# Patient Record
Sex: Male | Born: 1985
Health system: Southern US, Community
[De-identification: ages and names within clinical notes are randomized; demographics above are authoritative.]

## PROBLEM LIST (undated history)

## (undated) DIAGNOSIS — Z903 Acquired absence of stomach [part of]: Secondary | ICD-10-CM

## (undated) DIAGNOSIS — F419 Anxiety disorder, unspecified: Secondary | ICD-10-CM

## (undated) DIAGNOSIS — F32A Depression, unspecified: Secondary | ICD-10-CM

## (undated) DIAGNOSIS — Z9049 Acquired absence of other specified parts of digestive tract: Secondary | ICD-10-CM

## (undated) DIAGNOSIS — K649 Unspecified hemorrhoids: Secondary | ICD-10-CM

## (undated) DIAGNOSIS — Z8719 Personal history of other diseases of the digestive system: Secondary | ICD-10-CM

## (undated) DIAGNOSIS — J45909 Unspecified asthma, uncomplicated: Secondary | ICD-10-CM

## (undated) DIAGNOSIS — Z9889 Other specified postprocedural states: Secondary | ICD-10-CM

## (undated) HISTORY — PX: LAPAROSCOPIC GASTRIC SLEEVE RESECTION: SHX5895

## (undated) HISTORY — PX: OTHER SURGICAL HISTORY: SHX169

## (undated) HISTORY — PX: APPENDECTOMY: SHX54

## (undated) HISTORY — PX: HERNIA REPAIR: SHX51

## (undated) HISTORY — PX: HEMORRHOID SURGERY: SHX153

## (undated) HISTORY — PX: HIP SURGERY: SHX245

## (undated) HISTORY — DX: Personal history of other diseases of the digestive system: Z87.19

## (undated) HISTORY — DX: Anxiety disorder, unspecified: F41.9

## (undated) HISTORY — DX: Acquired absence of other specified parts of digestive tract: Z90.49

## (undated) HISTORY — DX: Acquired absence of stomach (part of): Z90.3

## (undated) HISTORY — DX: Unspecified hemorrhoids: K64.9

## (undated) HISTORY — DX: Other specified postprocedural states: Z98.890

## (undated) HISTORY — DX: Depression, unspecified: F32.A

## (undated) HISTORY — DX: Unspecified asthma, uncomplicated: J45.909

---

## 2019-11-09 ENCOUNTER — Encounter: Payer: Self-pay | Admitting: Emergency Medicine

## 2019-11-09 ENCOUNTER — Other Ambulatory Visit: Payer: Self-pay

## 2019-11-09 ENCOUNTER — Emergency Department
Admission: EM | Admit: 2019-11-09 | Discharge: 2019-11-09 | Disposition: A | Discharge status: Home / Self Care | Payer: Commercial Managed Care - PPO | Attending: Emergency Medicine | Admitting: Emergency Medicine

## 2019-11-09 ENCOUNTER — Emergency Department: Payer: Commercial Managed Care - PPO

## 2019-11-09 DIAGNOSIS — S93401A Sprain of unspecified ligament of right ankle, initial encounter: Secondary | ICD-10-CM | POA: Diagnosis not present

## 2019-11-09 DIAGNOSIS — Y9301 Activity, walking, marching and hiking: Secondary | ICD-10-CM | POA: Diagnosis not present

## 2019-11-09 DIAGNOSIS — Y990 Civilian activity done for income or pay: Secondary | ICD-10-CM | POA: Insufficient documentation

## 2019-11-09 DIAGNOSIS — Y9269 Other specified industrial and construction area as the place of occurrence of the external cause: Secondary | ICD-10-CM | POA: Diagnosis not present

## 2019-11-09 DIAGNOSIS — W1842XA Slipping, tripping and stumbling without falling due to stepping into hole or opening, initial encounter: Secondary | ICD-10-CM | POA: Diagnosis not present

## 2019-11-09 DIAGNOSIS — S299XXA Unspecified injury of thorax, initial encounter: Secondary | ICD-10-CM

## 2019-11-09 DIAGNOSIS — W19XXXA Unspecified fall, initial encounter: Secondary | ICD-10-CM

## 2019-11-09 DIAGNOSIS — S99911A Unspecified injury of right ankle, initial encounter: Secondary | ICD-10-CM | POA: Diagnosis present

## 2019-11-09 MED ORDER — LIDOCAINE 5 % EX PTCH: 1.0000 | MEDICATED_PATCH | Freq: Two times a day (BID) | CUTANEOUS | 0 refills | Status: AC

## 2019-11-09 MED ORDER — OXYCODONE-ACETAMINOPHEN 5-325 MG PO TABS: 1.0000 | ORAL_TABLET | Freq: Four times a day (QID) | ORAL | 0 refills | Status: AC | PRN

## 2019-11-09 MED ORDER — IBUPROFEN 600 MG PO TABS: 600.0000 mg | ORAL_TABLET | Freq: Four times a day (QID) | ORAL | 0 refills | Status: DC | PRN

## 2019-11-09 NOTE — ED Notes (Signed)
Pt verbalizes understanding of d/c instructions, medications and follow up 

## 2019-11-09 NOTE — ED Triage Notes (Signed)
Pt in via EMS from work with c/o pain to left ribs  Pt was on bouncy house Saturday and felt a pop in the left side of his ribs and has had intermittent pain since then

## 2019-11-09 NOTE — ED Triage Notes (Signed)
Pt reports Saturday was at a birthday party and was jumping around in a bouncy house and hurt his left side ribs. Pt states this am he felt a pop in his ribs when he bent over.

## 2019-11-09 NOTE — ED Provider Notes (Signed)
[facility] Emergency Department Provider Note  ____________________________________________  Time seen: Approximately 10:44 AM  I have reviewed the triage vital signs and the nursing notes.   HISTORY  Chief Complaint Rib Injury    HPI Brandon Stout is a 34 y.o. male that presents to the emergency department for evaluation of left rib injury.  Patient states that over the weekend, he was on a bouncy house when he initially hurt his left side.  He had some pain over the weekend but seemed like it was getting better.  This morning he went over to bend his shoe, heard a pop and pain has been worse since.  Pain to the spot on his ribs is worse with deep breathing.  No shortness of breath, nausea, vomiting, abdominal pain.   History reviewed. No pertinent past medical history.  There are no problems to display for this patient.   History reviewed. No pertinent surgical history.  Prior to Admission medications   Not on File    Allergies Patient has no allergy information on record.  No family history on file.  Social History Social History   Tobacco Use  . Smoking status: Not on file  Substance Use Topics  . Alcohol use: Not on file  . Drug use: Not on file     Review of Systems  Respiratory: No cough. No SOB. Gastrointestinal: No abdominal pain.  No nausea, no vomiting.  Musculoskeletal: Positive for left rib pain. Skin: Negative for rash, abrasions, lacerations, ecchymosis. Neurological: Negative for headaches, numbness or tingling   ____________________________________________   PHYSICAL EXAM:  VITAL SIGNS: ED Triage Vitals  Enc Vitals Group     BP --      Pulse Rate 11/09/19 0951 85     Resp 11/09/19 0951 20     Temp 11/09/19 0951 98.3 F (36.8 C)     Temp Source 11/09/19 0951 Oral     SpO2 11/09/19 0951 98 %     Weight 11/09/19 0945 175 lb (79.4 kg)     Height 11/09/19 0945 5\' 8"  (1.727 m)     Head Circumference --       Peak Flow --      Pain Score 11/09/19 0945 10     Pain Loc --      Pain Edu? --      Excl. in GC? --      Constitutional: Alert and oriented. Well appearing and in no acute distress. Eyes: Conjunctivae are normal. PERRL. EOMI. Head: Atraumatic. ENT:      Ears:      Nose: No congestion/rhinnorhea.      Mouth/Throat: Mucous membranes are moist.  Neck: No stridor.  Cardiovascular: Normal rate, regular rhythm.  Good peripheral circulation. Respiratory: Normal respiratory effort without tachypnea or retractions. Lungs CTAB. Good air entry to the bases with no decreased or absent breath sounds. Gastrointestinal: Bowel sounds 4 quadrants. Soft and nontender to palpation. No guarding or rigidity. No palpable masses. No distention.  Musculoskeletal: Full range of motion to all extremities. No gross deformities appreciated.  Pinpoint tenderness to palpation to left anterior inferior chest wall. Neurologic:  Normal speech and language. No gross focal neurologic deficits are appreciated.  Skin:  Skin is warm, dry and intact. No rash noted. Psychiatric: Mood and affect are normal. Speech and behavior are normal. Patient exhibits appropriate insight and judgement.   ____________________________________________   LABS (all labs ordered are listed, but only abnormal results are displayed)  Labs Reviewed -  No data to display ____________________________________________  EKG   ____________________________________________  RADIOLOGY Lexine Baton, personally viewed and evaluated these images (plain radiographs) as part of my medical decision making, as well as reviewing the written report by the radiologist.  DG Ribs Unilateral W/Chest Left  Result Date: 11/09/2019 CLINICAL DATA:  Pain after exaggerated motion EXAM: LEFT RIBS AND CHEST - 3+ VIEW COMPARISON:  None. FINDINGS: Frontal chest as well as oblique and cone-down rib images were obtained. Lungs are clear. Heart size and pulmonary  vascularity are normal. No adenopathy. No pneumothorax or pleural effusion. No evident rib fracture. IMPRESSION: No evident rib fracture.  Lungs clear. Electronically Signed   By: Bretta Bang III M.D.   On: 11/09/2019 10:13    ____________________________________________    PROCEDURES  Procedure(s) performed:    Procedures    Medications - No data to display   ____________________________________________   INITIAL IMPRESSION / ASSESSMENT AND PLAN / ED COURSE  Pertinent labs & imaging results that were available during my care of the patient were reviewed by me and considered in my medical decision making (see chart for details).  Review of the [employer] CSRS was performed in accordance of the [facility] prior to dispensing any controlled drugs.   Patient presented to the emergency department for evaluation of rib injury.  Vital signs and exam are reassuring.  X-ray negative for acute bony abnormality or cardiopulmonary processes.  Patient does have pinpoint tenderness to palpation to left, inferior, anterior rib cage and may have a small fracture not identified on x-ray.  Patient will be discharged home with prescriptions for a short course of Percocet. Patient is to follow up with primary care orthopedics as directed. Patient is given ED precautions to return to the ED for any worsening or new symptoms.   [name] was evaluated in Emergency Department on 11/09/2019 for the symptoms described in the history of present illness.  Patient was seen and evaluated by myself in triage. He was evaluated in the context of the global COVID-19 pandemic, which necessitated consideration that the patient might be at risk for infection with the SARS-CoV-2 virus that causes COVID-19. Institutional protocols and algorithms that pertain to the evaluation of patients at risk for COVID-19 are in a state of rapid change based on information released by regulatory bodies including the CDC and federal and  state organizations. These policies and algorithms were followed during the patient's care in the ED.  ____________________________________________  FINAL CLINICAL IMPRESSION(S) / ED DIAGNOSES  Final diagnoses:  Rib injury  Fall, initial encounter      NEW MEDICATIONS STARTED DURING THIS VISIT:  ED Discharge Orders    None          This chart was dictated using voice recognition software/Dragon. Despite best efforts to proofread, errors can occur which can change the meaning. Any change was purely unintentional.    Enid Derry, PA-C 11/09/19 1145    Shaune Pollack, MD 11/10/19 1218

## 2019-11-09 NOTE — Discharge Instructions (Signed)
Your rib x-ray was normal and did not show a fracture.  A small fracture may not show up right away on x-ray.  You may take ibuprofen for pain and swelling and Percocet for severe pain.

## 2019-11-22 ENCOUNTER — Other Ambulatory Visit: Payer: Self-pay | Admitting: Physician Assistant

## 2019-11-22 DIAGNOSIS — S299XXA Unspecified injury of thorax, initial encounter: Secondary | ICD-10-CM

## 2019-11-23 ENCOUNTER — Other Ambulatory Visit: Payer: Self-pay | Admitting: Physician Assistant

## 2019-11-23 DIAGNOSIS — S299XXA Unspecified injury of thorax, initial encounter: Secondary | ICD-10-CM

## 2019-11-23 DIAGNOSIS — R1012 Left upper quadrant pain: Secondary | ICD-10-CM

## 2019-11-24 ENCOUNTER — Ambulatory Visit
Admission: RE | Admit: 2019-11-24 | Discharge: 2019-11-24 | Disposition: A | Discharge status: Home / Self Care | Payer: Commercial Managed Care - PPO | Source: Ambulatory Visit | Attending: Physician Assistant | Admitting: Physician Assistant

## 2019-11-24 ENCOUNTER — Other Ambulatory Visit: Payer: Self-pay

## 2019-11-24 DIAGNOSIS — R1012 Left upper quadrant pain: Secondary | ICD-10-CM | POA: Diagnosis present

## 2019-11-24 DIAGNOSIS — S299XXA Unspecified injury of thorax, initial encounter: Secondary | ICD-10-CM

## 2019-11-30 ENCOUNTER — Other Ambulatory Visit: Payer: Self-pay

## 2019-11-30 ENCOUNTER — Ambulatory Visit (INDEPENDENT_AMBULATORY_CARE_PROVIDER_SITE_OTHER): Payer: Self-pay

## 2019-11-30 ENCOUNTER — Other Ambulatory Visit: Payer: Self-pay | Admitting: Physician Assistant

## 2019-11-30 DIAGNOSIS — Z021 Encounter for pre-employment examination: Secondary | ICD-10-CM

## 2021-03-22 IMAGING — CR DG RIBS W/ CHEST 3+V*L*
1 series · 3 of 3 positions shown · non-contrast
Comparison: None.

CLINICAL DATA: Pain after exaggerated motion

EXAM:
LEFT RIBS AND CHEST - 3+ VIEW

[Series 1: dg ribs unilateral w/chest left · 0.14mm/px · 3 of 3 slices shown]
[im 1/3]
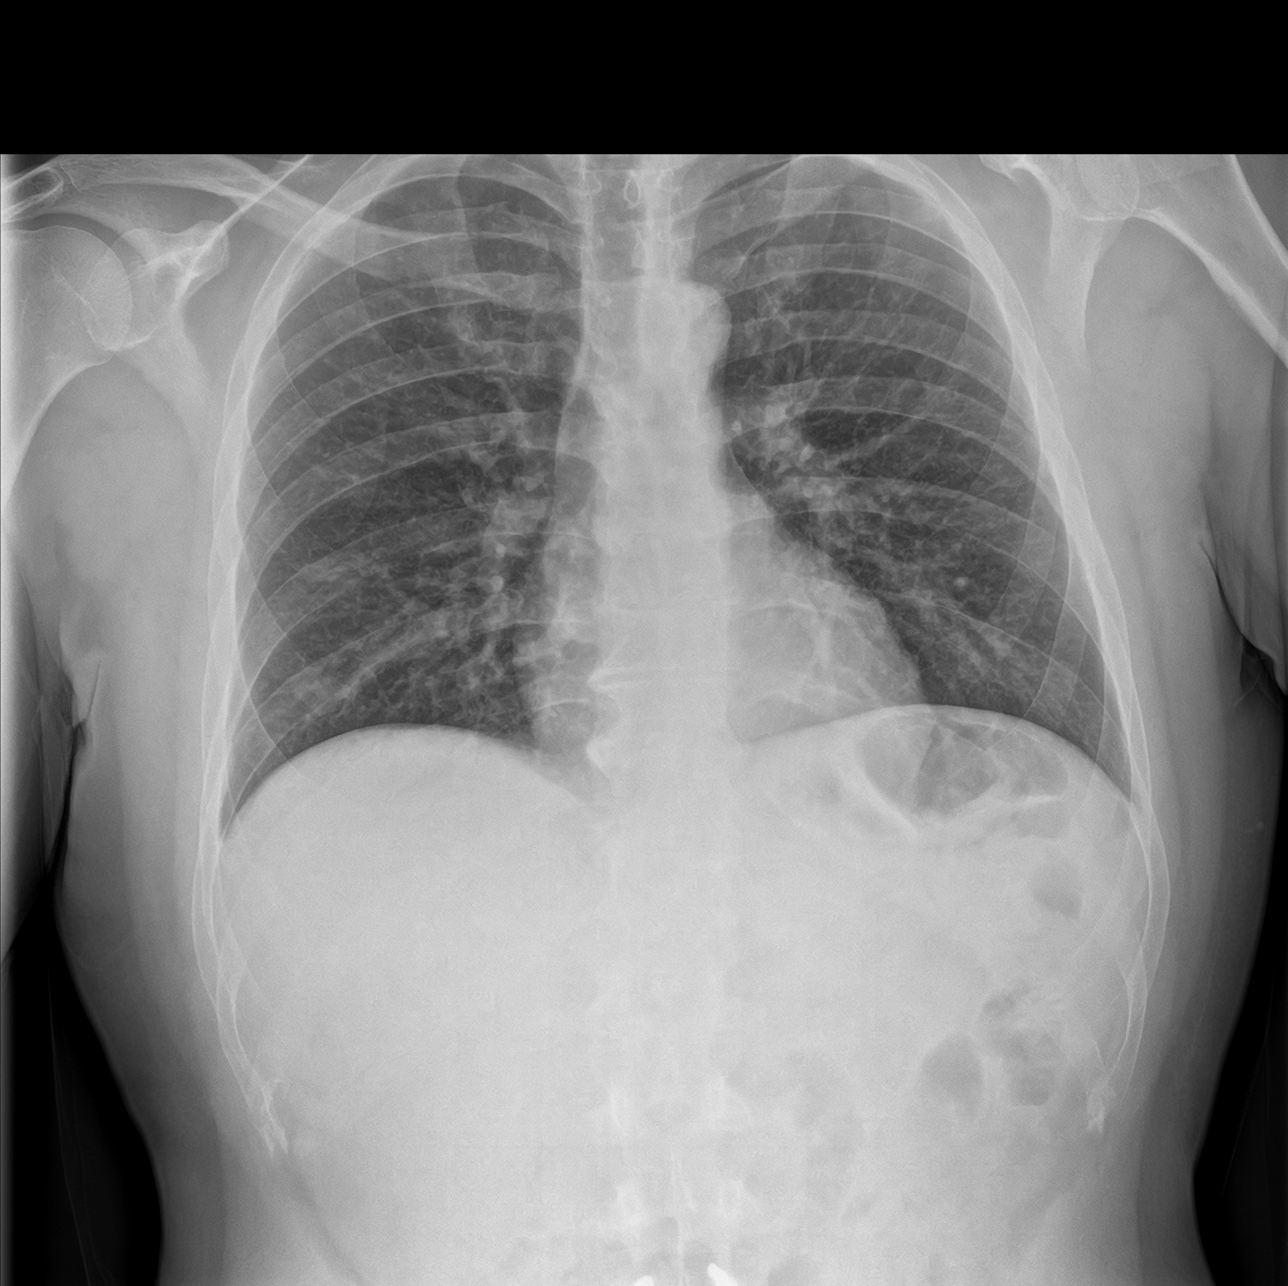
[im 2/3]
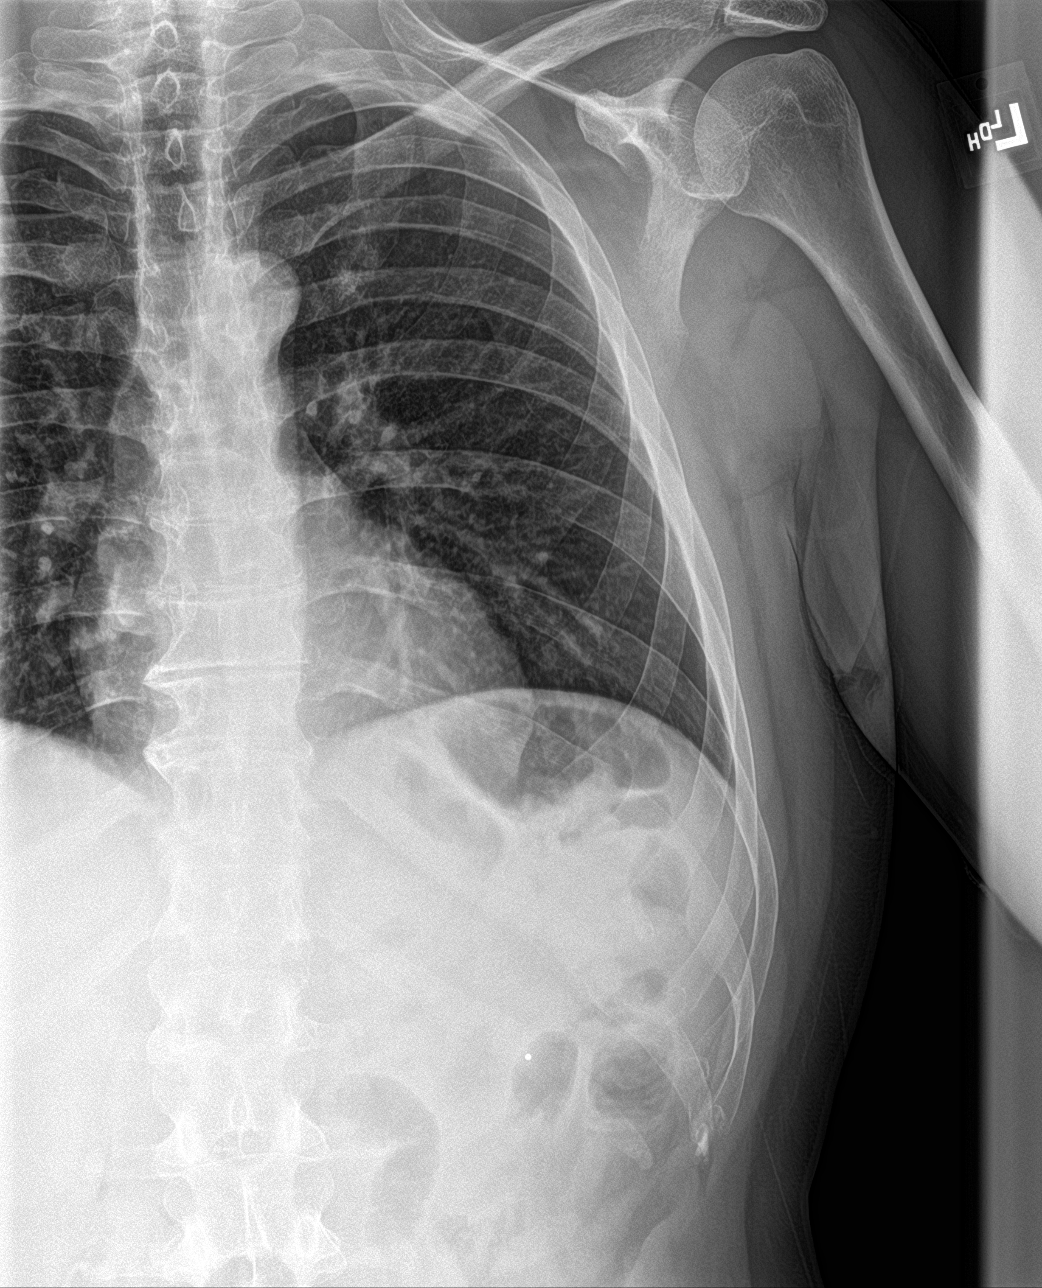
[im 3/3]
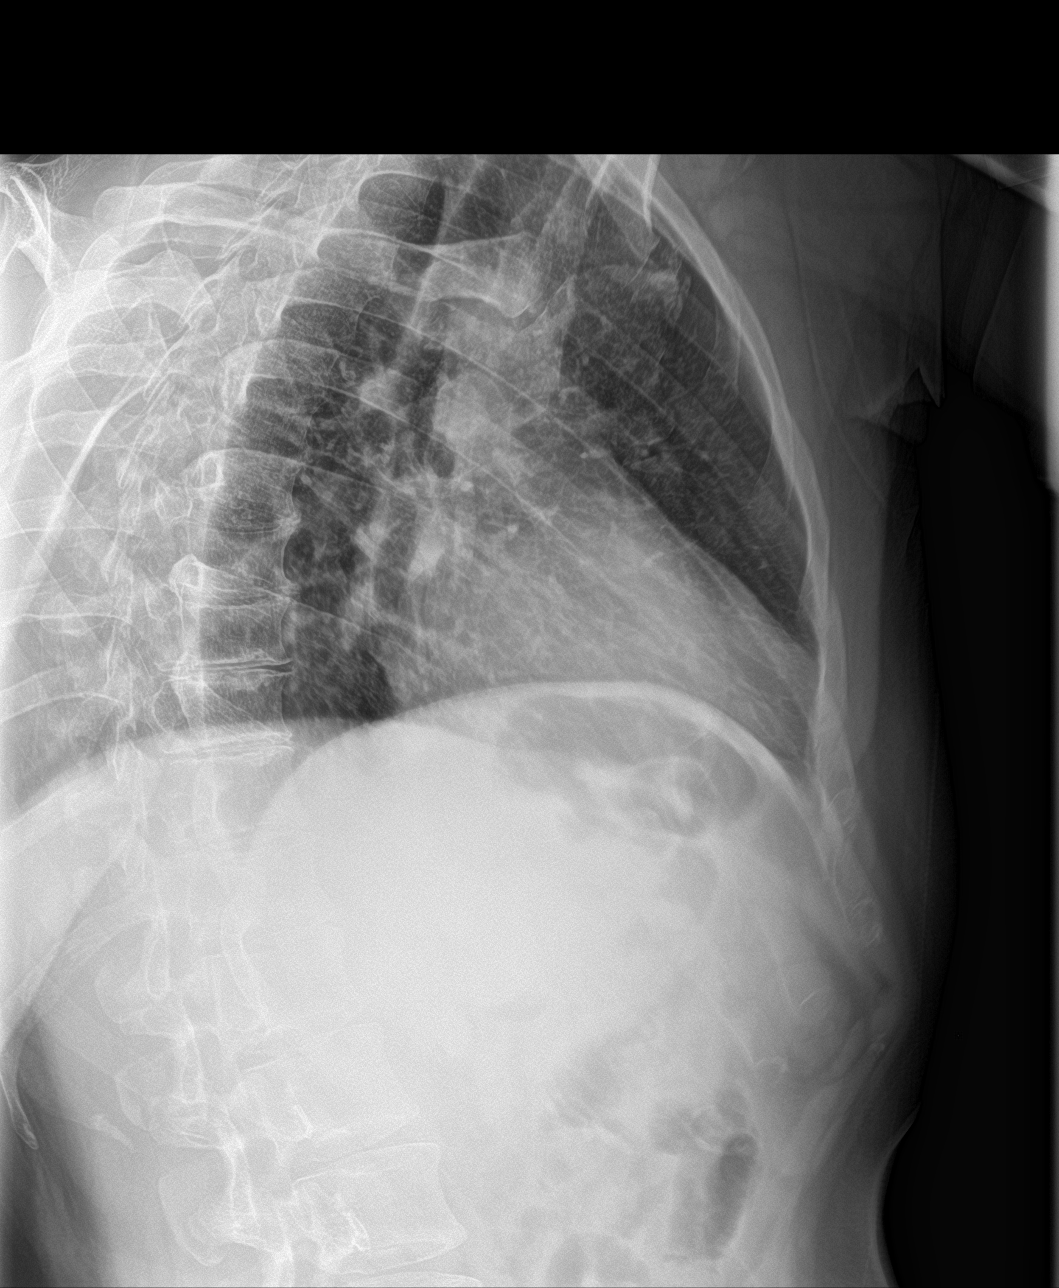

[3 of 3 positions shown; findings below may reference images not displayed]

FINDINGS: Frontal chest as well as oblique and cone-down rib images were
obtained. Lungs are clear. Heart size and pulmonary vascularity are
normal. No adenopathy. No pneumothorax or pleural effusion. No
evident rib fracture.
IMPRESSION: No evident rib fracture.  Lungs clear.

## 2021-04-06 IMAGING — US US ABDOMEN LIMITED
1 series · 7 of 7 positions shown · non-contrast
Comparison: None

CLINICAL DATA: Unspecified injury of the thorax, LEFT rib fractures
and LEFT upper quadrant pain, evaluate spleen

EXAM:
ULTRASOUND ABDOMEN LIMITED

[Series 1: us spleen (abdomen limited) · 7 of 7 slices shown]
[im 1/7]
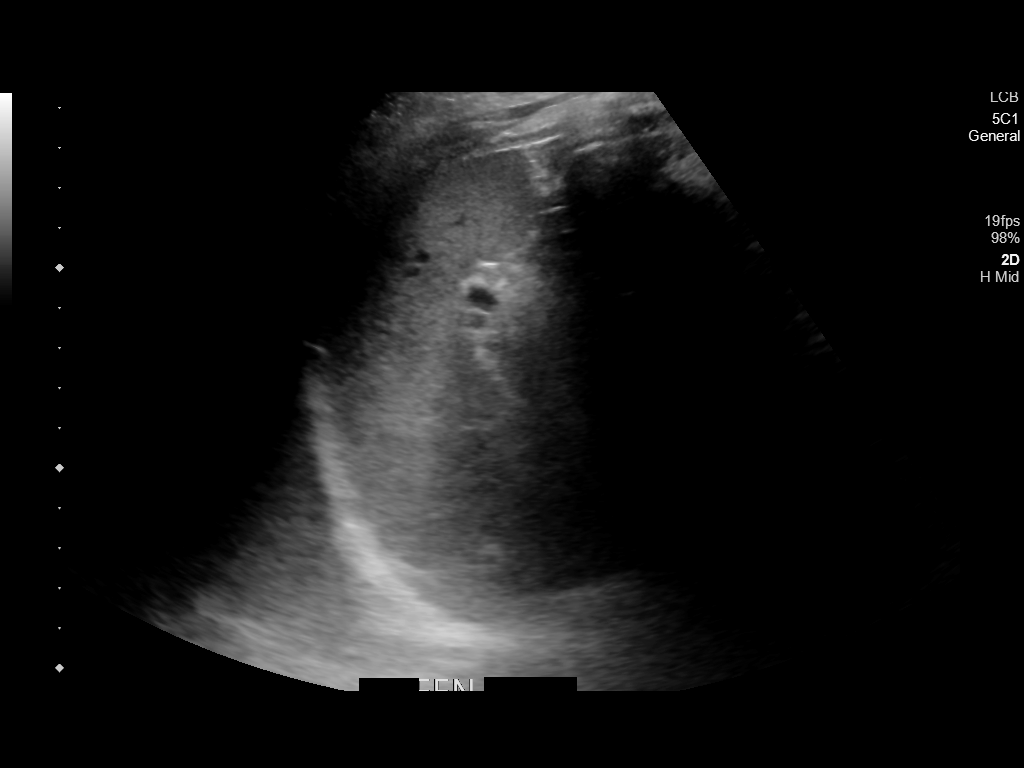
[im 2/7]
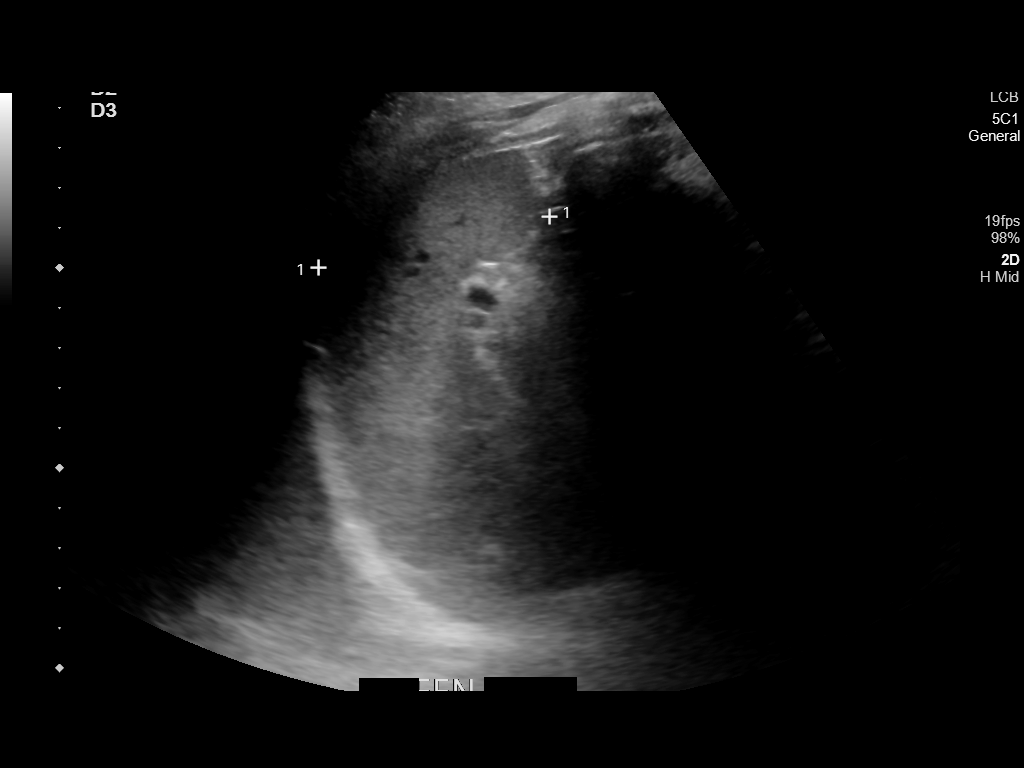
[im 3/7]
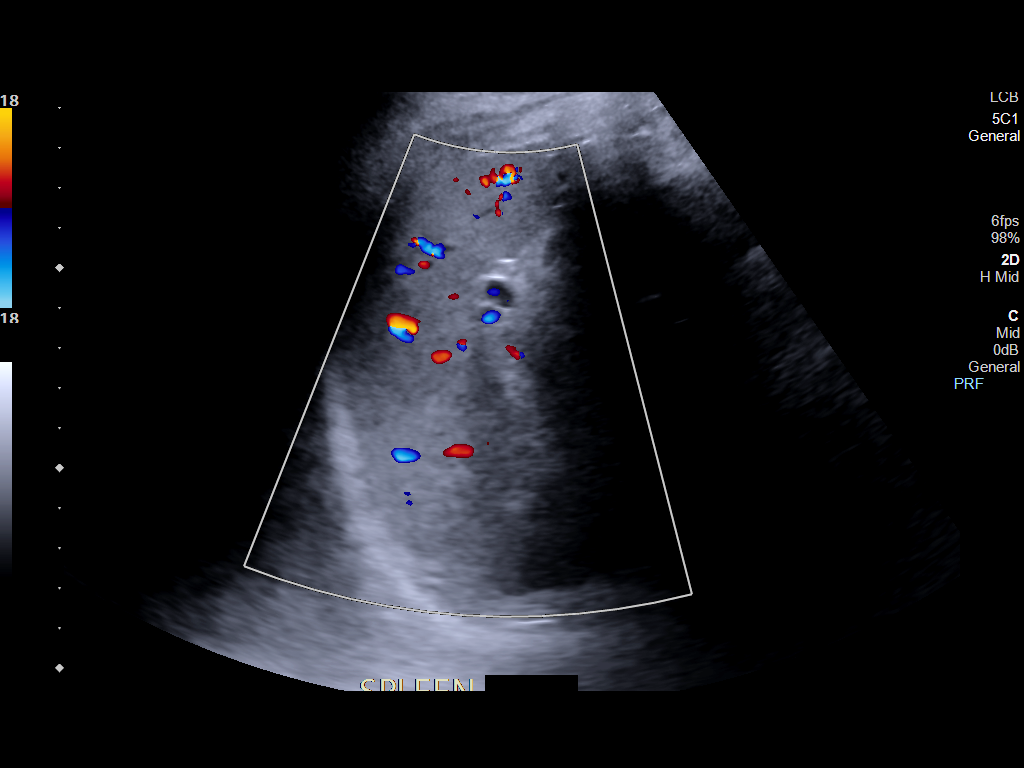
[im 4/7]
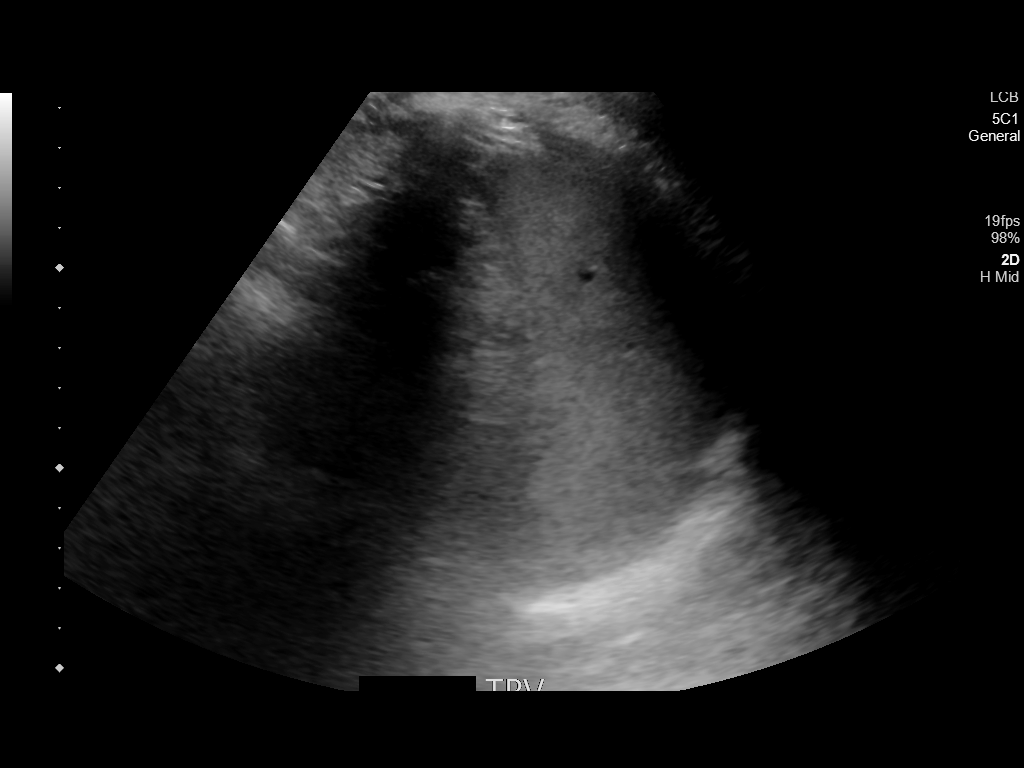
[im 5/7]
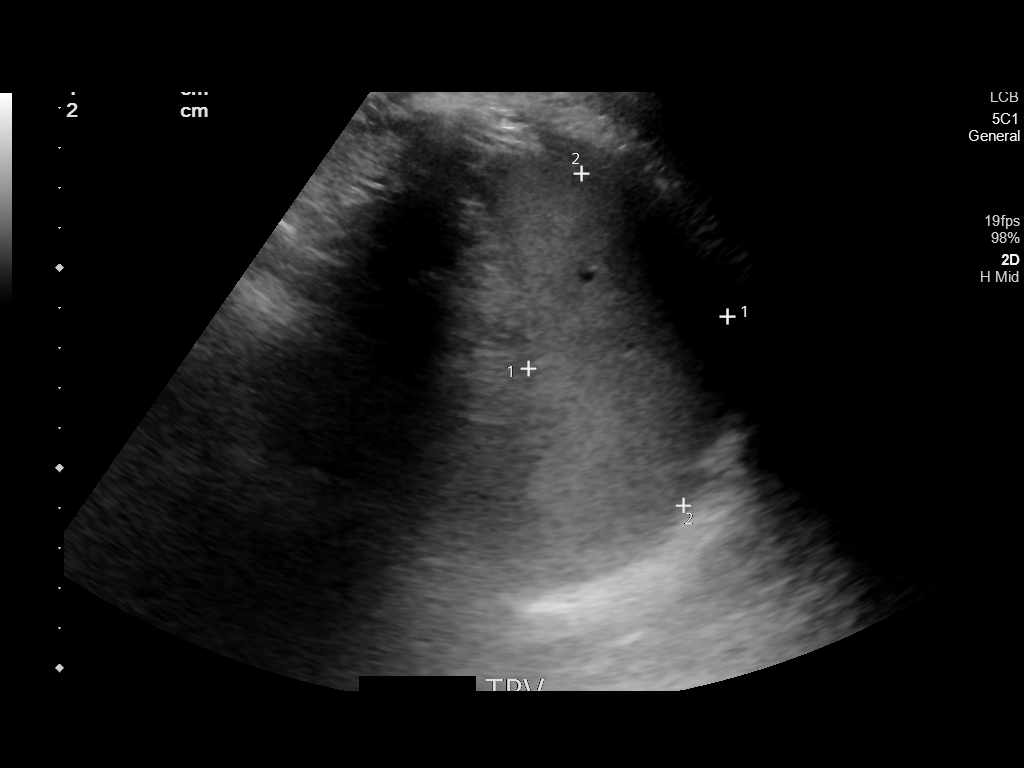
[im 6/7]
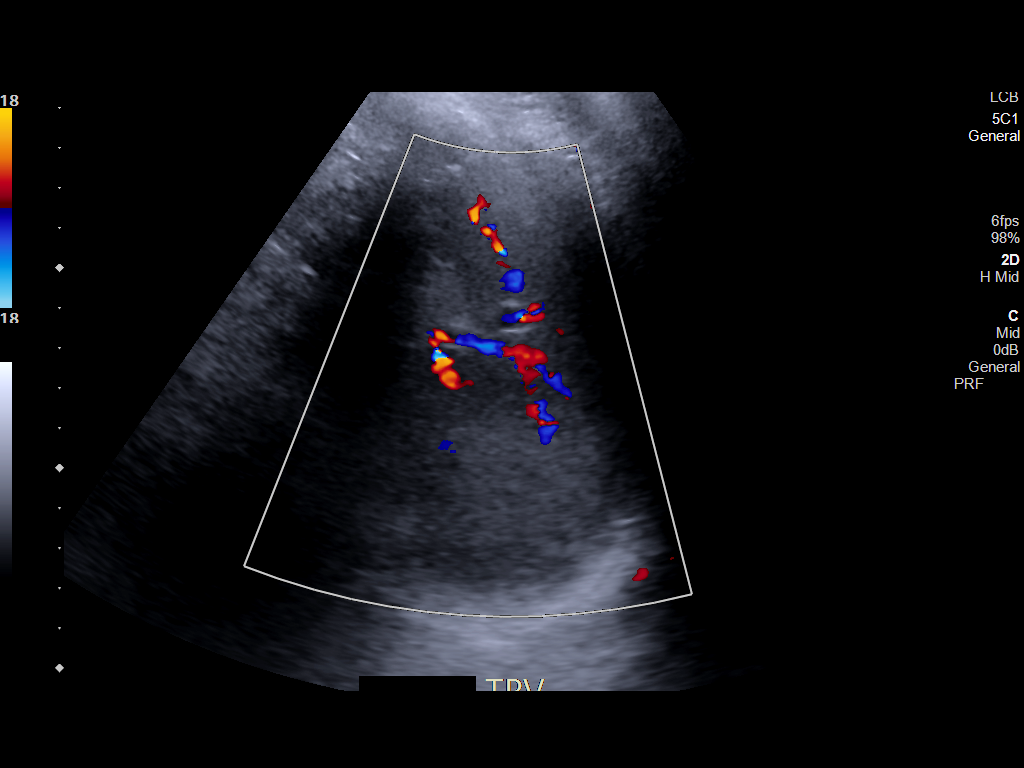
[im 7/7]
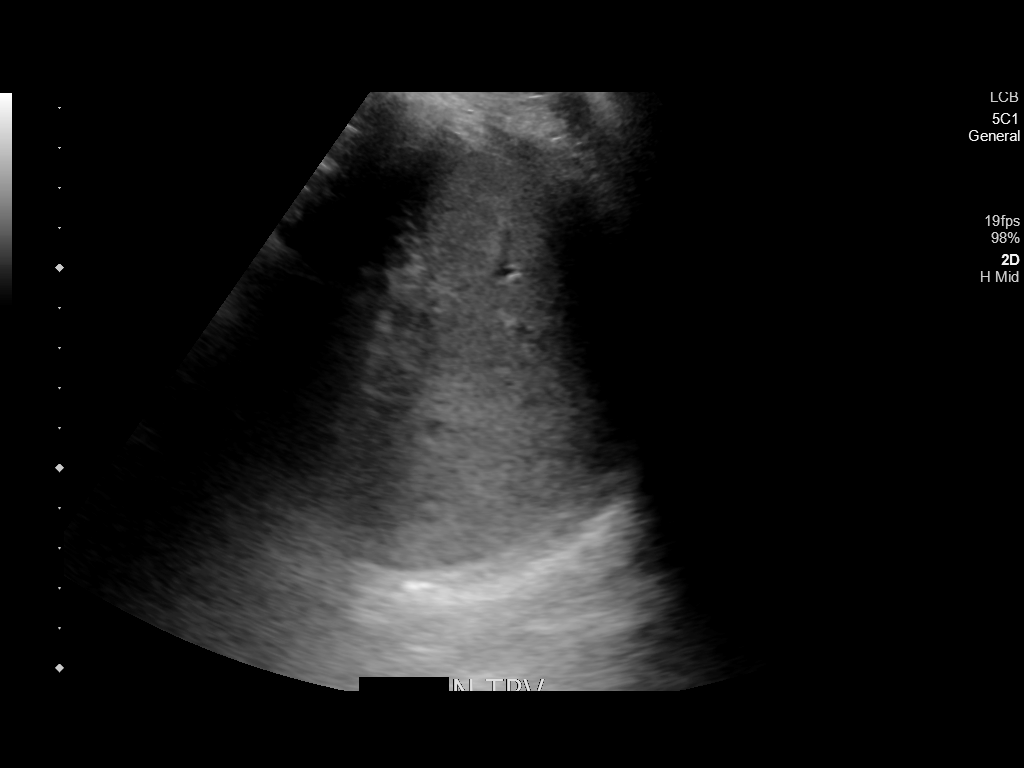

[7 of 7 positions shown; findings below may reference images not displayed]

FINDINGS: Suboptimal splenic visualization due to ribs, high subcostal
position, and bowel.

Spleen is normal in size at 5.9 x 5.1 x 8.7 cm.

No gross splenic mass or abnormality.

No perisplenic fluid seen.
IMPRESSION: No definite abnormalities of the spleen are sonographically
identified on limited imaging.

If patient has persistent symptoms, recommend CT abdomen with IV
contrast to further assess.

## 2021-04-12 IMAGING — DX DG CHEST 1V
1 series · 1 of 1 positions shown · non-contrast
Comparison: November 09, 2019

CLINICAL DATA: Pre-employment physical examination

EXAM:
CHEST  1 VIEW

[chest pa]
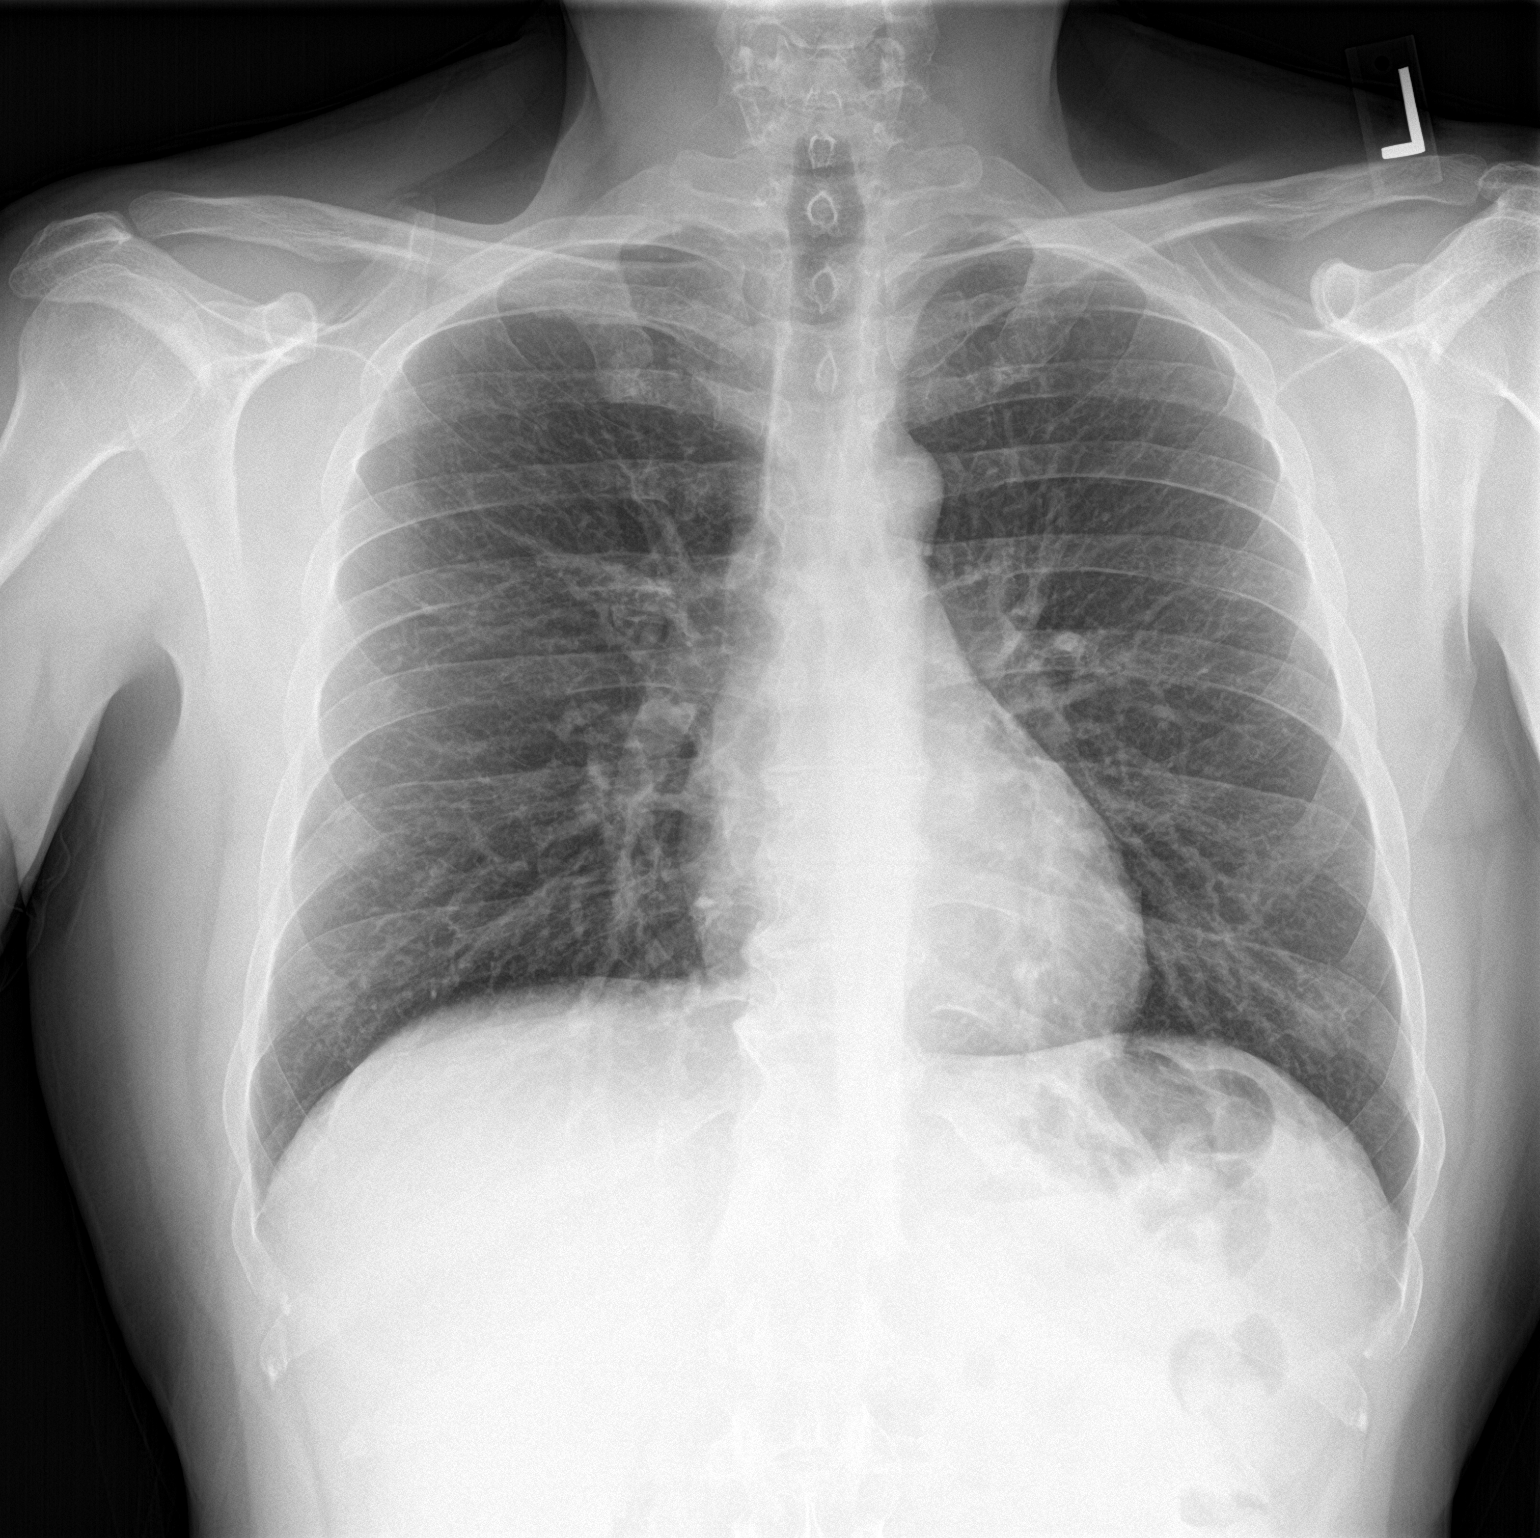

[1 of 1 positions shown; findings below may reference images not displayed]

FINDINGS: The lungs are clear. The heart size and pulmonary vascularity are
normal. No adenopathy. No bone lesions.
IMPRESSION: Lungs clear.  Cardiac silhouette normal.

## 2021-09-14 NOTE — Progress Notes (Unsigned)
   New Patient Office Visit  Subjective:  Patient ID: Brandon Stout, male    DOB: 04/24/1985  Age: 37 y.o. MRN: [id number]  CC: No chief complaint on file.    HPI Brandon Stout presents to establish care.   No past medical history on file.  *** The histories are not reviewed yet. Please review them in the "History" navigator section and refresh this SmartLink.  No family history on file.  Social History   Socioeconomic History   Marital status: Not on file    Spouse name: Not on file   Number of children: Not on file   Years of education: Not on file   Highest education level: Not on file  Occupational History   Not on file  Tobacco Use   Smoking status: Not on file   Smokeless tobacco: Not on file  Substance and Sexual Activity   Alcohol use: Not on file   Drug use: Not on file   Sexual activity: Not on file  Other Topics Concern   Not on file  Social History Narrative   Not on file   Social Determinants of Health   Financial Resource Strain: Not on file  Food Insecurity: Not on file  Transportation Needs: Not on file  Physical Activity: Not on file  Stress: Not on file  Social Connections: Not on file  Intimate Partner Violence: Not on file    ROS Review of Systems  Objective:   Today's Vitals: There were no vitals taken for this visit.  Physical Exam  Assessment & Plan:   1. Encounter to establish care ***   No outpatient encounter medications on file as of 09/15/2021.   No facility-administered encounter medications on file as of 09/15/2021.    Follow-up: No follow-ups on file.   Thayer Ohm, DNP, APRN, FNP-BC [facility]

## 2021-09-15 ENCOUNTER — Encounter: Payer: Self-pay | Admitting: Medical-Surgical

## 2021-09-15 DIAGNOSIS — Z7689 Persons encountering health services in other specified circumstances: Secondary | ICD-10-CM

## 2021-10-05 NOTE — Progress Notes (Unsigned)
   New Patient Office Visit  Subjective    Patient ID: Brandon Stout, male    DOB: 19-Oct-1985  Age: 36 y.o. MRN: [id number]  CC: No chief complaint on file.   HPI Brandon Stout presents to establish care ***  No outpatient encounter medications on file as of 10/06/2021.   No facility-administered encounter medications on file as of 10/06/2021.    No past medical history on file.  *** The histories are not reviewed yet. Please review them in the "History" navigator section and refresh this SmartLink.  No family history on file.  Social History   Socioeconomic History   Marital status: Not on file    Spouse name: Not on file   Number of children: Not on file   Years of education: Not on file   Highest education level: Not on file  Occupational History   Not on file  Tobacco Use   Smoking status: Not on file   Smokeless tobacco: Not on file  Substance and Sexual Activity   Alcohol use: Not on file   Drug use: Not on file   Sexual activity: Not on file  Other Topics Concern   Not on file  Social History Narrative   Not on file   Social Determinants of Health   Financial Resource Strain: Not on file  Food Insecurity: Not on file  Transportation Needs: Not on file  Physical Activity: Not on file  Stress: Not on file  Social Connections: Not on file  Intimate Partner Violence: Not on file    ROS      Objective    There were no vitals taken for this visit.  Physical Exam  {Labs (Optional):23779}    Assessment & Plan:   Problem List Items Addressed This Visit   None   No follow-ups on file.   Charlton Amor, DO

## 2021-10-06 ENCOUNTER — Ambulatory Visit (INDEPENDENT_AMBULATORY_CARE_PROVIDER_SITE_OTHER): Payer: Commercial Managed Care - PPO | Admitting: Family Medicine

## 2021-10-06 ENCOUNTER — Encounter: Payer: Self-pay | Admitting: Family Medicine

## 2021-10-06 VITALS — BP 136/87 | HR 98 | Wt 174.0 lb

## 2021-10-06 DIAGNOSIS — Z903 Acquired absence of stomach [part of]: Secondary | ICD-10-CM | POA: Diagnosis not present

## 2021-10-06 DIAGNOSIS — K219 Gastro-esophageal reflux disease without esophagitis: Secondary | ICD-10-CM | POA: Diagnosis not present

## 2021-10-06 DIAGNOSIS — J4540 Moderate persistent asthma, uncomplicated: Secondary | ICD-10-CM | POA: Diagnosis not present

## 2021-10-06 MED ORDER — FLUTICASONE-SALMETEROL 100-50 MCG/ACT IN AEPB: 1.0000 | INHALATION_SPRAY | Freq: Two times a day (BID) | RESPIRATORY_TRACT | 3 refills | Status: DC

## 2021-10-06 MED ORDER — FAMOTIDINE 20 MG PO TABS: 20.0000 mg | ORAL_TABLET | Freq: Every day | ORAL | 1 refills | Status: DC

## 2021-10-06 MED ORDER — ALBUTEROL SULFATE HFA 108 (90 BASE) MCG/ACT IN AERS: 2.0000 | INHALATION_SPRAY | Freq: Four times a day (QID) | RESPIRATORY_TRACT | 3 refills | Status: DC | PRN

## 2021-10-06 NOTE — Assessment & Plan Note (Signed)
-   will start with pepcid daily  - if no better can take tums  - if needing tums regularly we can consider ppi

## 2021-10-06 NOTE — Assessment & Plan Note (Signed)
-   start advair BID to help better control asthma symptoms - gave albuterol rescue inhaler and counseled on use - if noticing that inhaler is not controlling cough symptoms encouraged him to return to follow up sooner so we can get PFT testing and figure out if this is asthma vs now copd with his smoking usage

## 2022-08-12 DIAGNOSIS — R198 Other specified symptoms and signs involving the digestive system and abdomen: Secondary | ICD-10-CM | POA: Diagnosis not present

## 2022-12-04 ENCOUNTER — Other Ambulatory Visit: Payer: Self-pay

## 2022-12-04 DIAGNOSIS — R0602 Shortness of breath: Secondary | ICD-10-CM | POA: Insufficient documentation

## 2022-12-04 DIAGNOSIS — R079 Chest pain, unspecified: Secondary | ICD-10-CM | POA: Diagnosis not present

## 2022-12-04 DIAGNOSIS — Z5321 Procedure and treatment not carried out due to patient leaving prior to being seen by health care provider: Secondary | ICD-10-CM | POA: Diagnosis not present

## 2022-12-04 DIAGNOSIS — M549 Dorsalgia, unspecified: Secondary | ICD-10-CM | POA: Insufficient documentation

## 2022-12-04 DIAGNOSIS — R6883 Chills (without fever): Secondary | ICD-10-CM | POA: Insufficient documentation

## 2022-12-04 DIAGNOSIS — R5383 Other fatigue: Secondary | ICD-10-CM | POA: Diagnosis not present

## 2022-12-04 NOTE — ED Triage Notes (Signed)
Pt reports taking ibuprofen at 6:30pm and aleve at 8pm for back pain. Then after taking the aleve patient started having centralized sharp CP  radiating down right arm. Pt reports associated SOB, fatigue, chills. Pain is 7/10 right now. No aggravating or alleviating factors noted. Denies Cardiac History.

## 2022-12-05 ENCOUNTER — Emergency Department: Payer: Commercial Managed Care - PPO

## 2022-12-05 ENCOUNTER — Emergency Department
Admission: EM | Admit: 2022-12-05 | Discharge: 2022-12-05 | Discharge status: Against Medical Advice | Payer: Commercial Managed Care - PPO | Attending: Emergency Medicine | Admitting: Emergency Medicine

## 2022-12-05 DIAGNOSIS — R079 Chest pain, unspecified: Secondary | ICD-10-CM | POA: Diagnosis not present

## 2022-12-05 LAB — BASIC METABOLIC PANEL
Anion gap: 12 (ref 5–15)
BUN: 16 mg/dL (ref 6–20)
CO2: 23 mmol/L (ref 22–32)
Calcium: 9.9 mg/dL (ref 8.9–10.3)
Chloride: 102 mmol/L (ref 98–111)
Creatinine, Ser: 0.89 mg/dL (ref 0.61–1.24)
GFR, Estimated: >60 mL/min (ref >60)
Glucose, Bld: 118 mg/dL — ABNORMAL HIGH (ref 70–99)
Potassium: 3.3 mmol/L — ABNORMAL LOW (ref 3.5–5.1)
Sodium: 137 mmol/L (ref 135–145)

## 2022-12-05 LAB — CBC
HCT: 43.9 % (ref 39.0–52.0)
Hemoglobin: 15 g/dL (ref 13.0–17.0)
MCH: 28.5 pg (ref 26.0–34.0)
MCHC: 34.2 g/dL (ref 30.0–36.0)
MCV: 83.3 fL (ref 80.0–100.0)
Platelets: 247 10*3/uL (ref 150–400)
RBC: 5.27 MIL/uL (ref 4.22–5.81)
RDW: 13.2 % (ref 11.5–15.5)
WBC: 7.5 10*3/uL (ref 4.0–10.5)
nRBC: 0 % (ref 0.0–0.2)

## 2022-12-05 LAB — TROPONIN I (HIGH SENSITIVITY): Troponin I (High Sensitivity): 3 ng/L (ref <18)

## 2022-12-07 ENCOUNTER — Telehealth: Payer: Self-pay | Admitting: General Practice

## 2022-12-07 NOTE — Transitions of Care (Post Inpatient/ED Visit) (Addendum)
   12/07/2022  Name: Brandon Stout MRN: [id number] DOB: November 11, 1985  Today's TOC FU Call Status: Today's TOC FU Call Status:: Successful TOC FU Call Completed TOC FU Call Complete Date: 12/07/22 Patient's Name and Date of Birth confirmed.  Transition Care Management Follow-up Telephone Call Date of Discharge: 12/05/22 Discharge Facility: [facility]) Type of Discharge: Emergency Department Reason for ED Visit: Cardiac Conditions Cardiac Conditions Diagnosis: Chest Pain Persisting (Left before seen) How have you been since you were released from the hospital?: Better Any questions or concerns?: No  Items Reviewed: Did you receive and understand the discharge instructions provided?: Yes Medications obtained,verified, and reconciled?: Yes (Medications Reviewed) Any new allergies since your discharge?: No Dietary orders reviewed?: NA Do you have support at home?: Yes  Medications Reviewed Today: Medications Reviewed Today     Reviewed by Modesto Charon, RN (Registered Nurse) on 12/07/22 at [phone]  Med List Status: <None>   Medication Order Taking? Sig Documenting Provider Last Dose Status Informant  albuterol (VENTOLIN HFA) 108 (90 Base) MCG/ACT inhaler [id number]  Inhale 2 puffs into the lungs every 6 (six) hours as needed for wheezing or shortness of breath. Charlton Amor, DO  Active   famotidine (PEPCID) 20 MG tablet [id number]  Take 1 tablet (20 mg total) by mouth daily. Charlton Amor, DO  Active   fluticasone-salmeterol (ADVAIR) 100-50 MCG/ACT AEPB [id number]  Inhale 1 puff into the lungs 2 (two) times daily. Charlton Amor, DO  Active   ibuprofen (ADVIL) 600 MG tablet [id number]  Take 1 tablet (600 mg total) by mouth every 6 (six) hours as needed. Enid Derry, PA-C  Active             Home Care and Equipment/Supplies: Were Home Health Services Ordered?: NA Any new equipment or medical supplies ordered?: NA  Functional Questionnaire: Do you  need assistance with bathing/showering or dressing?: No Do you need assistance with meal preparation?: No Do you need assistance with eating?: No Do you have difficulty maintaining continence: No Do you need assistance with getting out of bed/getting out of a chair/moving?: No Do you have difficulty managing or taking your medications?: No  Follow up appointments reviewed: PCP Follow-up appointment confirmed?: Yes Follow-up Provider: Dr. Tamera Punt Specialist [facility] Follow-up appointment confirmed?: NA Do you need transportation to your follow-up appointment?: No Do you understand care options if your condition(s) worsen?: Yes-patient verbalized understanding  SDOH Interventions Today    Flowsheet Row Most Recent Value  SDOH Interventions   Transportation Interventions Intervention Not Indicated       SIGNATURE Modesto Charon, RN BSN Nurse Health Advisor

## 2022-12-11 ENCOUNTER — Ambulatory Visit (INDEPENDENT_AMBULATORY_CARE_PROVIDER_SITE_OTHER): Payer: BC Managed Care – PPO | Admitting: Family Medicine

## 2022-12-11 ENCOUNTER — Encounter: Payer: Self-pay | Admitting: Family Medicine

## 2022-12-11 VITALS — BP 141/95 | HR 89 | Ht 67.0 in | Wt 165.8 lb

## 2022-12-11 DIAGNOSIS — F321 Major depressive disorder, single episode, moderate: Secondary | ICD-10-CM | POA: Insufficient documentation

## 2022-12-11 DIAGNOSIS — K219 Gastro-esophageal reflux disease without esophagitis: Secondary | ICD-10-CM

## 2022-12-11 DIAGNOSIS — J4540 Moderate persistent asthma, uncomplicated: Secondary | ICD-10-CM

## 2022-12-11 DIAGNOSIS — R1084 Generalized abdominal pain: Secondary | ICD-10-CM | POA: Insufficient documentation

## 2022-12-11 DIAGNOSIS — R8290 Unspecified abnormal findings in urine: Secondary | ICD-10-CM | POA: Diagnosis not present

## 2022-12-11 DIAGNOSIS — E876 Hypokalemia: Secondary | ICD-10-CM | POA: Diagnosis not present

## 2022-12-11 DIAGNOSIS — R030 Elevated blood-pressure reading, without diagnosis of hypertension: Secondary | ICD-10-CM | POA: Insufficient documentation

## 2022-12-11 DIAGNOSIS — R739 Hyperglycemia, unspecified: Secondary | ICD-10-CM | POA: Diagnosis not present

## 2022-12-11 DIAGNOSIS — F411 Generalized anxiety disorder: Secondary | ICD-10-CM | POA: Insufficient documentation

## 2022-12-11 LAB — POCT URINALYSIS DIP (CLINITEK)
Bilirubin, UA: NEGATIVE
Blood, UA: NEGATIVE
Glucose, UA: NEGATIVE mg/dL
Ketones, POC UA: NEGATIVE mg/dL
Leukocytes, UA: NEGATIVE
Nitrite, UA: NEGATIVE
POC PROTEIN,UA: NEGATIVE
Spec Grav, UA: >=1.03 — AB (ref 1.010–1.025)
Urobilinogen, UA: 0.2 U/dL
pH, UA: 6 (ref 5.0–8.0)

## 2022-12-11 MED ORDER — ESCITALOPRAM OXALATE 10 MG PO TABS: 10.0000 mg | ORAL_TABLET | Freq: Every day | ORAL | 2 refills | Status: DC

## 2022-12-11 MED ORDER — AIRSUPRA 90-80 MCG/ACT IN AERO: 2.0000 | INHALATION_SPRAY | Freq: Four times a day (QID) | RESPIRATORY_TRACT | 11 refills | Status: AC | PRN

## 2022-12-11 MED ORDER — ALBUTEROL SULFATE HFA 108 (90 BASE) MCG/ACT IN AERS: 2.0000 | INHALATION_SPRAY | Freq: Four times a day (QID) | RESPIRATORY_TRACT | 3 refills | Status: AC | PRN

## 2022-12-11 MED ORDER — OMEPRAZOLE 20 MG PO CPDR: 20.0000 mg | DELAYED_RELEASE_CAPSULE | Freq: Every day | ORAL | 11 refills | Status: AC

## 2022-12-11 NOTE — Progress Notes (Signed)
Established patient visit   Patient: Brandon Stout   DOB: 10-07-1985   37 y.o. Male  MRN: [id number] Visit Date: 12/11/2022  Today's healthcare provider: Charlton Amor, DO   Chief Complaint  Patient presents with   Hospitalization Follow-up    Chest pain ER visit f/u pt said it has gotten better    SUBJECTIVE    Chief Complaint  Patient presents with   Hospitalization Follow-up    Chest pain ER visit f/u pt said it has gotten better   HPI HPI     Hospitalization Follow-up    Additional comments: Chest pain ER visit f/u pt said it has gotten better      Last edited by Roselyn Reef, CMA on 12/11/2022  8:13 AM.      Pt presents to follow up from ED. He went to the emergency room last weekend for concerns of chest pain but LWBS. He presents today in clinic for follow up. He is no longer having chest pain. Workup in the ED was negative for troponins. Did have hypokalemia and hyperglycemia on exam. Says he is no longer having chest pain. Said the pain was more in his chest and back. He did eat a fatty food meal that day prior to attack.  Review of Systems  Constitutional:  Negative for activity change, fatigue and fever.  Respiratory:  Negative for cough and shortness of breath.   Cardiovascular:  Negative for chest pain.  Gastrointestinal:  Negative for abdominal pain.  Genitourinary:  Negative for difficulty urinating.       Current Meds  Medication Sig   Albuterol-Budesonide (AIRSUPRA) 90-80 MCG/ACT AERO Inhale 2 puffs into the lungs every 6 (six) hours as needed.   escitalopram (LEXAPRO) 10 MG tablet Take 1 tablet (10 mg total) by mouth daily.   omeprazole (PRILOSEC) 20 MG capsule Take 1 capsule (20 mg total) by mouth daily.    OBJECTIVE    BP (!) 141/95 (BP Location: Left Arm, Patient Position: Sitting, Cuff Size: Normal)   Pulse 89   Ht 5\' 7"  (1.702 m)   Wt 165 lb 12 oz (75.2 kg)   SpO2 96%   BMI 25.96 kg/m   Physical Exam Vitals and nursing note  reviewed.  Constitutional:      General: He is not in acute distress.    Appearance: Normal appearance.  HENT:     Head: Normocephalic and atraumatic.     Right Ear: External ear normal.     Left Ear: External ear normal.     Nose: Nose normal.  Eyes:     Conjunctiva/sclera: Conjunctivae normal.  Cardiovascular:     Rate and Rhythm: Normal rate.  Pulmonary:     Effort: Pulmonary effort is normal.  Neurological:     General: No focal deficit present.     Mental Status: He is alert and oriented to person, place, and time.  Psychiatric:        Mood and Affect: Mood normal.        Behavior: Behavior normal.        Thought Content: Thought content normal.        Judgment: Judgment normal.        ASSESSMENT & PLAN    Problem List Items Addressed This Visit       Respiratory   Moderate persistent asthma    Ordered medication for patient to start taking for asthma      Relevant Medications   albuterol (VENTOLIN  HFA) 108 (90 Base) MCG/ACT inhaler   Albuterol-Budesonide (AIRSUPRA) 90-80 MCG/ACT AERO     Digestive   Gastroesophageal reflux disease without esophagitis    Filled omeprazole      Relevant Medications   omeprazole (PRILOSEC) 20 MG capsule     Other   Hyperglycemia   Relevant Orders   HgB A1c   Hypokalemia   Relevant Orders   Basic Metabolic Panel (BMET)   Generalized abdominal pain - Primary    With pt hx of chest pain and negative troponins on ED eval I am wondering if this was a gallbladder attack since patient had a fatty meal prior and had some radiation to his back  - will go ahead and order a RUQ [place] to further evaluate      Relevant Orders   US Abdomen Limited RUQ (LIVER/GB)   Current moderate episode of major depressive disorder without prior episode [facility])    - son is starting in the army next month and is very worried about him. Would like someone to talk to about this  - referral to therapy - started lexapro 10mg   - follow up 4 weeks       Relevant Medications   escitalopram (LEXAPRO) 10 MG tablet   Other Relevant Orders   Ambulatory referral to Behavioral Health   GAD (generalized anxiety disorder)   Relevant Medications   escitalopram (LEXAPRO) 10 MG tablet   Other Relevant Orders   Ambulatory referral to Behavioral Health   Elevated blood pressure reading    Repeat bp      Foul smelling urine    Pt noted foul smelling urine at end of visit. POC UA done and shows positive for dehydration with elevated specific gravity. Discussed using electrolyte packets to stay hydrated.      Relevant Orders   POCT URINALYSIS DIP (CLINITEK) (Completed)    Return in about 4 weeks (around 01/08/2023) for lexapro follow up .      Meds ordered this encounter  Medications   albuterol (VENTOLIN HFA) 108 (90 Base) MCG/ACT inhaler    Sig: Inhale 2 puffs into the lungs every 6 (six) hours as needed for wheezing or shortness of breath.    Dispense:  8 g    Refill:  3    Can substitute with what is on patient formulary   Albuterol-Budesonide (AIRSUPRA) 90-80 MCG/ACT AERO    Sig: Inhale 2 puffs into the lungs every 6 (six) hours as needed.    Dispense:  1 g    Refill:  11    Please dispense #1 inhaler   omeprazole (PRILOSEC) 20 MG capsule    Sig: Take 1 capsule (20 mg total) by mouth daily.    Dispense:  30 capsule    Refill:  11   escitalopram (LEXAPRO) 10 MG tablet    Sig: Take 1 tablet (10 mg total) by mouth daily.    Dispense:  30 tablet    Refill:  2    Orders Placed This Encounter  Procedures   US Abdomen Limited RUQ (LIVER/GB)    Standing Status:   Future    Standing Expiration Date:   12/11/2023    Order Specific Question:   Reason for Exam (SYMPTOM  OR DIAGNOSIS REQUIRED)    Answer:   RUQ pain and back pain    Order Specific Question:   Preferred imaging location?    Answer:   [facility]   Basic Metabolic Panel (BMET)   HgB A1c  Ambulatory referral to Behavioral Health    Referral Priority:    Routine    Referral Type:   Psychiatric    Referral Reason:   Specialty Services Required    Requested Specialty:   Behavioral Health    Number of Visits Requested:   1   POCT URINALYSIS DIP (CLINITEK)     Charlton Amor, DO  [facility] at [facility] [phone] (phone) [phone] (fax)  [facility]

## 2022-12-11 NOTE — Assessment & Plan Note (Signed)
With pt hx of chest pain and negative troponins on ED eval I am wondering if this was a gallbladder attack since patient had a fatty meal prior and had some radiation to his back  - will go ahead and order a RUQ [place] to further evaluate

## 2022-12-11 NOTE — Assessment & Plan Note (Signed)
Repeat bp

## 2022-12-11 NOTE — Assessment & Plan Note (Signed)
Filled omeprazole

## 2022-12-11 NOTE — Assessment & Plan Note (Signed)
-   son is starting in the army next month and is very worried about him. Would like someone to talk to about this  - referral to therapy - started lexapro 10mg   - follow up 4 weeks

## 2022-12-11 NOTE — Assessment & Plan Note (Signed)
Pt noted foul smelling urine at end of visit. POC UA done and shows positive for dehydration with elevated specific gravity. Discussed using electrolyte packets to stay hydrated.

## 2022-12-11 NOTE — Assessment & Plan Note (Signed)
Ordered medication for patient to start taking for asthma

## 2022-12-12 LAB — HEMOGLOBIN A1C
Est. average glucose Bld gHb Est-mCnc: 105 mg/dL
Hgb A1c MFr Bld: 5.3 % (ref 4.8–5.6)

## 2022-12-12 LAB — BASIC METABOLIC PANEL
BUN/Creatinine Ratio: 17 (ref 9–20)
BUN: 15 mg/dL (ref 6–20)
CO2: 22 mmol/L (ref 20–29)
Calcium: 9 mg/dL (ref 8.7–10.2)
Chloride: 103 mmol/L (ref 96–106)
Creatinine, Ser: 0.88 mg/dL (ref 0.76–1.27)
Glucose: 85 mg/dL (ref 70–99)
Potassium: 4.2 mmol/L (ref 3.5–5.2)
Sodium: 140 mmol/L (ref 134–144)
eGFR: 114 mL/min/{1.73_m2} (ref >59)

## 2022-12-28 ENCOUNTER — Ambulatory Visit: Payer: BC Managed Care – PPO

## 2022-12-28 DIAGNOSIS — R1011 Right upper quadrant pain: Secondary | ICD-10-CM | POA: Diagnosis not present

## 2022-12-28 DIAGNOSIS — R1084 Generalized abdominal pain: Secondary | ICD-10-CM

## 2023-01-08 ENCOUNTER — Ambulatory Visit: Payer: BC Managed Care – PPO | Admitting: Family Medicine

## 2023-02-22 ENCOUNTER — Ambulatory Visit: Payer: BC Managed Care – PPO | Admitting: Behavioral Health

## 2023-03-08 ENCOUNTER — Ambulatory Visit (INDEPENDENT_AMBULATORY_CARE_PROVIDER_SITE_OTHER): Payer: BC Managed Care – PPO | Admitting: Behavioral Health

## 2023-03-08 ENCOUNTER — Telehealth: Payer: Self-pay

## 2023-03-08 ENCOUNTER — Other Ambulatory Visit: Payer: Self-pay | Admitting: Family Medicine

## 2023-03-08 DIAGNOSIS — F411 Generalized anxiety disorder: Secondary | ICD-10-CM

## 2023-03-08 DIAGNOSIS — F4321 Adjustment disorder with depressed mood: Secondary | ICD-10-CM | POA: Diagnosis not present

## 2023-03-08 DIAGNOSIS — F331 Major depressive disorder, recurrent, moderate: Secondary | ICD-10-CM

## 2023-03-08 DIAGNOSIS — K219 Gastro-esophageal reflux disease without esophagitis: Secondary | ICD-10-CM

## 2023-03-08 NOTE — Telephone Encounter (Signed)
Copied from CRM [id number]. Topic: Clinical - Medication Refill >> Mar 08, 2023  2:50 PM Herbert Seta B wrote: Most Recent Primary Care Visit:  Provider: Charlton Amor  Department: [facility]  Visit Type: OFFICE VISIT  Date: 12/11/2022  Medication:  omeprazole (PRILOSEC) 20 MG capsule   Has the patient contacted their pharmacy? Yes-no refills (Agent: If no, request that the patient contact the pharmacy for the refill. If patient does not wish to contact the pharmacy document the reason why and proceed with request.) (Agent: If yes, when and what did the pharmacy advise?)  Is this the correct pharmacy for this prescription? yes If no, delete pharmacy and type the correct one.  This is the patient's preferred pharmacy:  [facility] [address] [place] - [address] [place] [address] Phone: [phone] Fax: [phone]    Has the prescription been filled recently? yes  Is the patient out of the medication? yes  Has the patient been seen for an appointment in the last year OR does the patient have an upcoming appointment? yes  Can we respond through MyChart? yes  Agent: Please be advised that Rx refills may take up to 3 business days. We ask that you follow-up with your pharmacy.

## 2023-03-08 NOTE — Telephone Encounter (Signed)
Duplicate request; patient has 11 refills on fill with [facility] pharmacy as on 11/2023, patient states he had not checked with his pharmacy first

## 2023-03-08 NOTE — Progress Notes (Unsigned)
                Yurika Pereda M Shanzay Hepworth, LCMHC 

## 2023-03-08 NOTE — Telephone Encounter (Signed)
Spoke with patient . Scheduled for Mychart video visit tomorrow at 2pm

## 2023-03-08 NOTE — Telephone Encounter (Signed)
Copied from CRM [id number]. Topic: Clinical - Prescription Issue >> Mar 08, 2023  2:51 PM Herbert Seta B wrote: Reason for CRM: escitalopram (LEXAPRO) 10 MG tablet Patient states medication makes him too sleepy, would like to know if can take something different.
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
## 2023-03-09 ENCOUNTER — Telehealth: Payer: BC Managed Care – PPO | Admitting: Family Medicine

## 2023-03-09 ENCOUNTER — Encounter: Payer: Self-pay | Admitting: Family Medicine

## 2023-03-09 ENCOUNTER — Encounter: Payer: Self-pay | Admitting: Behavioral Health

## 2023-03-09 DIAGNOSIS — F411 Generalized anxiety disorder: Secondary | ICD-10-CM

## 2023-03-09 DIAGNOSIS — F321 Major depressive disorder, single episode, moderate: Secondary | ICD-10-CM

## 2023-03-09 MED ORDER — FLUOXETINE HCL 10 MG PO TABS: 10.0000 mg | ORAL_TABLET | Freq: Every day | ORAL | 3 refills | Status: AC

## 2023-03-09 NOTE — Progress Notes (Signed)
Established patient visit   Patient: Brandon Stout   DOB: [birth year]   38 y.o. Male  MRN: [id number] Visit Date: 03/09/2023  Today's healthcare provider: Charlton Amor, DO   No chief complaint on file.   SUBJECTIVE   No chief complaint on file.  HPI   I connected with  Brandon Stout on 03/09/23 by a video and audio enabled telemedicine application and verified that I am speaking with the correct person using two identifiers.  Patient Location: Home  Provider Location: Office/Clinic  I discussed the limitations of evaluation and management by telemedicine. The patient expressed understanding and agreed to proceed.  Pt notes increase in anxiety and depression. He tried lexapro for a few weeks and said it made him too drowsy. He would get home around 7pm and would have to be up at 4am and would be unable to get up due to the medication.   He does follow with therapy which prompted him to make this appointment today.   Review of Systems  Constitutional:  Negative for activity change, fatigue and fever.  Respiratory:  Negative for cough and shortness of breath.   Cardiovascular:  Negative for chest pain.  Gastrointestinal:  Negative for abdominal pain.  Genitourinary:  Negative for difficulty urinating.  Psychiatric/Behavioral:  Negative for suicidal ideas.        Current Meds  Medication Sig   albuterol (VENTOLIN HFA) 108 (90 Base) MCG/ACT inhaler Inhale 2 puffs into the lungs every 6 (six) hours as needed for wheezing or shortness of breath.   Albuterol-Budesonide (AIRSUPRA) 90-80 MCG/ACT AERO Inhale 2 puffs into the lungs every 6 (six) hours as needed.   FLUoxetine (PROZAC) 10 MG tablet Take 1 tablet (10 mg total) by mouth daily.   omeprazole (PRILOSEC) 20 MG capsule Take 1 capsule (20 mg total) by mouth daily.   [DISCONTINUED] escitalopram (LEXAPRO) 10 MG tablet Take 1 tablet (10 mg total) by mouth daily.    OBJECTIVE      Physical Exam Vitals reviewed.   Constitutional:      Appearance: He is well-developed.  HENT:     Head: Normocephalic and atraumatic.  Eyes:     Conjunctiva/sclera: Conjunctivae normal.  Cardiovascular:     Rate and Rhythm: Normal rate.  Pulmonary:     Effort: Pulmonary effort is normal.  Skin:    General: Skin is dry.     Coloration: Skin is not pale.  Neurological:     Mental Status: He is alert and oriented to person, place, and time.  Psychiatric:        Behavior: Behavior normal.        ASSESSMENT & PLAN    Problem List Items Addressed This Visit       Other   Current moderate episode of major depressive disorder without prior episode (HCC)   Relevant Medications   FLUoxetine (PROZAC) 10 MG tablet   GAD (generalized anxiety disorder) - Primary   Pt admits to feelings of anxiety and MDD with worsening symptoms. Will go ahead and start patient on prozac 10mg   - discussed option of adding wellbutrin but we will not add at this time so we can only add one medication at a time - he will continue to follow with therapy - denies any homicidal or suicidal ideation      Relevant Medications   FLUoxetine (PROZAC) 10 MG tablet    Return in about 4 weeks (around 04/06/2023).      Meds ordered  this encounter  Medications   FLUoxetine (PROZAC) 10 MG tablet    Sig: Take 1 tablet (10 mg total) by mouth daily.    Dispense:  30 tablet    Refill:  3    No orders of the defined types were placed in this encounter.    Charlton Amor, DO  [facility] at [facility] [phone] (phone) [phone] (fax)  [facility]

## 2023-03-09 NOTE — Assessment & Plan Note (Addendum)
Pt admits to feelings of anxiety and MDD with worsening symptoms. Will go ahead and start patient on prozac 10mg   - discussed option of adding wellbutrin but we will not add at this time so we can only add one medication at a time - he will continue to follow with therapy - denies any homicidal or suicidal ideation

## 2023-03-17 ENCOUNTER — Telehealth: Payer: BC Managed Care – PPO

## 2023-03-17 ENCOUNTER — Telehealth: Payer: BC Managed Care – PPO | Admitting: Physician Assistant

## 2023-03-17 DIAGNOSIS — R6889 Other general symptoms and signs: Secondary | ICD-10-CM

## 2023-03-17 MED ORDER — FLUTICASONE PROPIONATE 50 MCG/ACT NA SUSP: 2.0000 | Freq: Every day | NASAL | 0 refills | Status: DC

## 2023-03-17 MED ORDER — BENZONATATE 100 MG PO CAPS: 100.0000 mg | ORAL_CAPSULE | Freq: Three times a day (TID) | ORAL | 0 refills | Status: DC | PRN

## 2023-03-17 MED ORDER — OSELTAMIVIR PHOSPHATE 75 MG PO CAPS: 75.0000 mg | ORAL_CAPSULE | Freq: Two times a day (BID) | ORAL | 0 refills | Status: DC

## 2023-03-17 NOTE — Progress Notes (Signed)
E visit for Flu like symptoms   We are sorry that you are not feeling well.  Here is how we plan to help! Based on what you have shared with me it looks like you may have a respiratory virus that may be influenza.  Influenza or "the flu" is   an infection caused by a respiratory virus. The flu virus is highly contagious and persons who did not receive their yearly flu vaccination may "catch" the flu from close contact.  We have anti-viral medications to treat the viruses that cause this infection. They are not a "cure" and only shorten the course of the infection. These prescriptions are most effective when they are given within the first 2 days of "flu" symptoms. Antiviral medication are indicated if you have a high risk of complications from the flu. You should  also consider an antiviral medication if you are in close contact with someone who is at risk. These medications can help patients avoid complications from the flu  but have side effects that you should know. Possible side effects from Tamiflu or oseltamivir include nausea, vomiting, diarrhea, dizziness, headaches, eye redness, sleep problems or other respiratory symptoms. You should not take Tamiflu if you have an allergy to oseltamivir or any to the ingredients in Tamiflu.  Based upon your symptoms and potential risk factors I have prescribed Oseltamivir (Tamiflu).  It has been sent to your designated pharmacy.  You will take one 75 mg capsule orally twice a day for the next 5 days. and I recommend that you follow the flu symptoms recommendation that I have listed below.  Upper respiratory infections can be transmitted from person to person, with the most common method of transmission being a person's hands.  The virus is able to live on the skin and can infect other persons for up to 2 hours after direct contact.  Also, these can be transmitted when someone coughs or sneezes; thus, it is important to cover the mouth to reduce this risk.  To  keep the spread of the illness at bay, good hand hygiene is very important.  For nasal congestion, you may use an oral decongestants such as Mucinex D or if you have glaucoma or high blood pressure use plain Mucinex.  Saline nasal spray or nasal drops can help and can safely be used as often as needed for congestion.  For your congestion, I have prescribed Fluticasone nasal spray one spray in each nostril twice a day  If you do not have a history of heart disease, hypertension, diabetes or thyroid disease, prostate/bladder issues or glaucoma, you may also use Sudafed to treat nasal congestion.  It is highly recommended that you consult with a pharmacist or your primary care physician to ensure this medication is safe for you to take.     If you have a cough, you may use cough suppressants such as Delsym and Robitussin.  If you have glaucoma or high blood pressure, you can also use Coricidin HBP.   For cough I have prescribed for you A prescription cough medication called Tessalon Perles 100 mg. You may take 1-2 capsules every 8 hours as needed for cough  If you have a sore or scratchy throat, use a saltwater gargle-  to  teaspoon of salt dissolved in a 4-ounce to 8-ounce glass of warm water.  Gargle the solution for approximately 15-30 seconds and then spit.  It is important not to swallow the solution.  You can also use throat lozenges/cough  drops and Chloraseptic spray to help with throat pain or discomfort.  Warm or cold liquids can also be helpful in relieving throat pain.  For headache, pain or general discomfort, you can use Ibuprofen or Tylenol as directed.   Some authorities believe that zinc sprays or the use of Echinacea may shorten the course of your symptoms.  ANYONE WHO HAS FLU SYMPTOMS SHOULD: Stay home. The flu is highly contagious and going out or to work exposes others! Be sure to drink plenty of fluids. Water is fine as well as fruit juices, sodas and electrolyte beverages. You  may want to stay away from caffeine or alcohol. If you are nauseated, try taking small sips of liquids. How do you know if you are getting enough fluid? Your urine should be a pale yellow or almost colorless. Get rest. Taking a steamy shower or using a humidifier may help nasal congestion and ease sore throat pain. Using a saline nasal spray works much the same way. Cough drops, hard candies and sore throat lozenges may ease your cough. Line up a caregiver. Have someone check on you regularly.   GET HELP RIGHT AWAY IF: You cannot keep down liquids or your medications. You become short of breath Your fell like you are going to pass out or loose consciousness. Your symptoms persist after you have completed your treatment plan MAKE SURE YOU  Understand these instructions. Will watch your condition. Will get help right away if you are not doing well or get worse.  Your e-visit answers were reviewed by a board certified advanced clinical practitioner to complete your personal care plan.  Depending on the condition, your plan could have included both over the counter or prescription medications.  If there is a problem please reply  once you have received a response from your provider.  Your safety is important to [place].  If you have drug allergies check your prescription carefully.    You can use MyChart to ask questions about today's visit, request a non-urgent call back, or ask for a work or school excuse for 24 hours related to this e-Visit. If it has been greater than 24 hours you will need to follow up with your provider, or enter a new e-Visit to address those concerns.  You will get an e-mail in the next two days asking about your experience.  I hope that your e-visit has been valuable and will speed your recovery. Thank you for using e-visits.   I have spent 5 minutes in review of e-visit questionnaire, review and updating patient chart, medical decision making and response to patient.    Margaretann Loveless, PA-C

## 2023-03-22 ENCOUNTER — Ambulatory Visit: Payer: BC Managed Care – PPO | Admitting: Family Medicine

## 2023-03-22 NOTE — Progress Notes (Deleted)
     Established patient visit   Patient: Brandon Stout   DOB: [birth year]   37 y.o. Male  MRN: [id number] Visit Date: 03/22/2023  Today's healthcare provider: Charlton Amor, DO   No chief complaint on file.   SUBJECTIVE   No chief complaint on file.  HPI  Pt presents to discuss MDD and GAD symptoms. He is currently on prozac 10mg  that was started two weeks ago. He is a little early for follow up on efficacy of prozac 10mg .   Review of Systems     No outpatient medications have been marked as taking for the 03/22/23 encounter (Appointment) with Charlton Amor, DO.    OBJECTIVE    There were no vitals taken for this visit.  Physical Exam     ASSESSMENT & PLAN    Problem List Items Addressed This Visit   None   No follow-ups on file.      No orders of the defined types were placed in this encounter.   No orders of the defined types were placed in this encounter.    Charlton Amor, DO  [facility] at [facility] [phone] (phone) [phone] (fax)  [facility]

## 2023-04-08 ENCOUNTER — Ambulatory Visit: Payer: BC Managed Care – PPO | Admitting: Behavioral Health

## 2023-04-22 ENCOUNTER — Ambulatory Visit: Payer: BC Managed Care – PPO | Admitting: Behavioral Health

## 2023-06-15 ENCOUNTER — Telehealth: Admitting: Physician Assistant

## 2023-06-15 ENCOUNTER — Encounter: Payer: Self-pay | Admitting: Family Medicine

## 2023-06-15 DIAGNOSIS — J069 Acute upper respiratory infection, unspecified: Secondary | ICD-10-CM | POA: Diagnosis not present

## 2023-06-15 MED ORDER — BENZONATATE 100 MG PO CAPS: 100.0000 mg | ORAL_CAPSULE | Freq: Three times a day (TID) | ORAL | 0 refills | Status: AC | PRN

## 2023-06-15 NOTE — Progress Notes (Signed)

## 2023-06-15 NOTE — Progress Notes (Signed)
 I have spent 5 minutes in review of e-visit questionnaire, review and updating patient chart, medical decision making and response to patient.   Piedad Climes, PA-C

## 2023-06-24 ENCOUNTER — Encounter: Payer: Self-pay | Admitting: Family Medicine

## 2023-09-24 DIAGNOSIS — W230XXA Caught, crushed, jammed, or pinched between moving objects, initial encounter: Secondary | ICD-10-CM | POA: Diagnosis not present

## 2023-09-24 DIAGNOSIS — Y990 Civilian activity done for income or pay: Secondary | ICD-10-CM | POA: Diagnosis not present

## 2023-09-24 DIAGNOSIS — F17210 Nicotine dependence, cigarettes, uncomplicated: Secondary | ICD-10-CM | POA: Diagnosis not present

## 2023-09-24 DIAGNOSIS — M79601 Pain in right arm: Secondary | ICD-10-CM | POA: Diagnosis not present

## 2023-11-19 ENCOUNTER — Encounter

## 2023-11-19 ENCOUNTER — Telehealth: Admitting: Physician Assistant

## 2023-11-19 DIAGNOSIS — R112 Nausea with vomiting, unspecified: Secondary | ICD-10-CM | POA: Diagnosis not present

## 2023-11-19 MED ORDER — ONDANSETRON 4 MG PO TBDP: 4.0000 mg | ORAL_TABLET | Freq: Three times a day (TID) | ORAL | 0 refills | Status: DC | PRN

## 2023-11-19 NOTE — Progress Notes (Signed)

## 2024-01-20 ENCOUNTER — Telehealth: Admitting: Physician Assistant

## 2024-01-20 DIAGNOSIS — A084 Viral intestinal infection, unspecified: Secondary | ICD-10-CM | POA: Diagnosis not present

## 2024-01-20 MED ORDER — ONDANSETRON 4 MG PO TBDP: 4.0000 mg | ORAL_TABLET | Freq: Three times a day (TID) | ORAL | 0 refills | Status: AC | PRN

## 2024-01-20 NOTE — Progress Notes (Signed)
